# Patient Record
Sex: Female | Born: 1965 | Race: White | Hispanic: No | Marital: Married
Health system: Southern US, Community
[De-identification: ages and names within clinical notes are randomized; demographics above are authoritative.]

## PROBLEM LIST (undated history)

## (undated) DIAGNOSIS — R1900 Intra-abdominal and pelvic swelling, mass and lump, unspecified site: Secondary | ICD-10-CM

## (undated) DIAGNOSIS — C801 Malignant (primary) neoplasm, unspecified: Secondary | ICD-10-CM

## (undated) HISTORY — PX: WISDOM TOOTH EXTRACTION: SHX21

## (undated) HISTORY — PX: TOTAL ABDOMINAL HYSTERECTOMY W/ BILATERAL SALPINGOOPHORECTOMY: SHX83

## (undated) HISTORY — DX: Malignant (primary) neoplasm, unspecified: C80.1

## (undated) HISTORY — DX: Intra-abdominal and pelvic swelling, mass and lump, unspecified site: R19.00

## (undated) HISTORY — PX: RIGHT COLECTOMY: SHX853

---

## 1998-06-07 ENCOUNTER — Ambulatory Visit (HOSPITAL_COMMUNITY): Admission: RE | Admit: 1998-06-07 | Discharge: 1998-06-07 | Payer: Self-pay | Admitting: Obstetrics & Gynecology

## 1998-09-08 ENCOUNTER — Inpatient Hospital Stay (HOSPITAL_COMMUNITY): Admission: AD | Admit: 1998-09-08 | Discharge: 1998-09-11 | Payer: Self-pay | Admitting: Obstetrics and Gynecology

## 1999-03-18 ENCOUNTER — Other Ambulatory Visit: Admission: RE | Admit: 1999-03-18 | Discharge: 1999-03-18 | Payer: Self-pay | Admitting: Obstetrics and Gynecology

## 2000-01-06 ENCOUNTER — Other Ambulatory Visit: Admission: RE | Admit: 2000-01-06 | Discharge: 2000-01-06 | Payer: Self-pay | Admitting: Obstetrics and Gynecology

## 2000-12-10 ENCOUNTER — Other Ambulatory Visit: Admission: RE | Admit: 2000-12-10 | Discharge: 2000-12-10 | Payer: Self-pay | Admitting: Obstetrics and Gynecology

## 2001-04-26 ENCOUNTER — Ambulatory Visit (HOSPITAL_COMMUNITY): Admission: RE | Admit: 2001-04-26 | Discharge: 2001-04-26 | Payer: Self-pay | Admitting: Obstetrics and Gynecology

## 2001-07-07 ENCOUNTER — Inpatient Hospital Stay (HOSPITAL_COMMUNITY): Admission: AD | Admit: 2001-07-07 | Discharge: 2001-07-10 | Payer: Self-pay | Admitting: *Deleted

## 2001-09-28 ENCOUNTER — Other Ambulatory Visit: Admission: RE | Admit: 2001-09-28 | Discharge: 2001-09-28 | Payer: Self-pay | Admitting: Obstetrics and Gynecology

## 2002-12-23 HISTORY — PX: AUGMENTATION MAMMAPLASTY: SUR837

## 2003-02-06 ENCOUNTER — Other Ambulatory Visit: Admission: RE | Admit: 2003-02-06 | Discharge: 2003-02-06 | Payer: Self-pay | Admitting: Obstetrics and Gynecology

## 2004-07-26 ENCOUNTER — Other Ambulatory Visit: Admission: RE | Admit: 2004-07-26 | Discharge: 2004-07-26 | Payer: Self-pay | Admitting: Obstetrics and Gynecology

## 2005-10-22 ENCOUNTER — Other Ambulatory Visit: Admission: RE | Admit: 2005-10-22 | Discharge: 2005-10-22 | Payer: Self-pay | Admitting: Obstetrics and Gynecology

## 2012-01-05 ENCOUNTER — Other Ambulatory Visit: Payer: Self-pay | Admitting: Family Medicine

## 2012-01-10 NOTE — Telephone Encounter (Signed)
NOT SEEN HERE SINCE 05/12/2010.  Needs OV for refills.

## 2012-06-23 ENCOUNTER — Other Ambulatory Visit: Payer: Self-pay | Admitting: Family Medicine

## 2012-06-24 NOTE — Telephone Encounter (Signed)
Chart pulled to PA pool at nurses station DOS 05/12/10

## 2016-12-18 ENCOUNTER — Other Ambulatory Visit: Payer: Self-pay | Admitting: Sports Medicine

## 2016-12-18 DIAGNOSIS — M79671 Pain in right foot: Principal | ICD-10-CM

## 2016-12-18 DIAGNOSIS — G8929 Other chronic pain: Secondary | ICD-10-CM

## 2017-01-15 ENCOUNTER — Ambulatory Visit
Admission: RE | Admit: 2017-01-15 | Discharge: 2017-01-15 | Disposition: A | Discharge status: Home / Self Care | Payer: BLUE CROSS/BLUE SHIELD | Source: Ambulatory Visit | Attending: Sports Medicine | Admitting: Sports Medicine

## 2017-01-15 DIAGNOSIS — M79671 Pain in right foot: Principal | ICD-10-CM

## 2017-01-15 DIAGNOSIS — G8929 Other chronic pain: Secondary | ICD-10-CM

## 2018-12-09 ENCOUNTER — Other Ambulatory Visit: Payer: Self-pay

## 2018-12-10 ENCOUNTER — Encounter: Payer: Self-pay | Admitting: Obstetrics & Gynecology

## 2018-12-10 ENCOUNTER — Ambulatory Visit (INDEPENDENT_AMBULATORY_CARE_PROVIDER_SITE_OTHER): Payer: BLUE CROSS/BLUE SHIELD | Admitting: Obstetrics & Gynecology

## 2018-12-10 ENCOUNTER — Other Ambulatory Visit: Payer: Self-pay

## 2018-12-10 VITALS — BP 126/74 | Ht 65.5 in | Wt 132.0 lb

## 2018-12-10 DIAGNOSIS — Z30431 Encounter for routine checking of intrauterine contraceptive device: Secondary | ICD-10-CM | POA: Diagnosis not present

## 2018-12-10 DIAGNOSIS — Z01419 Encounter for gynecological examination (general) (routine) without abnormal findings: Secondary | ICD-10-CM

## 2018-12-10 NOTE — Progress Notes (Signed)
SACRED ROA [birth year] [id number]   History:    53 y.o. G2P2L2 Married.  24 yo daughter 1st gyn visit with me today.  RP:  New (> 3 yrs) patient presenting for annual gyn exam   HPI: On Mirena IUD since October 26, 2013.  No menstrual periods and no breakthrough bleeding.  No pelvic pain.  Occasional hot flushes and night sweats.  No pain with intercourse.  Urine and bowel movements normal.  Breasts normal.  Body mass index 21.63.  Physically active.  Will follow-up here for fasting health labs  Past medical history,surgical history, family history and social history were all reviewed and documented in the EPIC chart.  Gynecologic History No LMP recorded. (Menstrual status: IUD). Contraception: Mirena IUD x 10/26/2013 Last Pap: 10/2013. Results were: normal Last mammogram: Never.  Will schedule screening mammo now Bone Density: Never Colonoscopy: Never.  Will refer to [facility].  Obstetric History OB History  Gravida Para Term Preterm AB Living  '2 2       2  '$ SAB TAB Ectopic Multiple Live Births               # Outcome Date GA Lbr Len/2nd Weight Sex Delivery Anes PTL Lv  2 Para           1 Para              ROS: A ROS was performed and pertinent positives and negatives are included in the history.  GENERAL: No fevers or chills. HEENT: No change in vision, no earache, sore throat or sinus congestion. NECK: No pain or stiffness. CARDIOVASCULAR: No chest pain or pressure. No palpitations. PULMONARY: No shortness of breath, cough or wheeze. GASTROINTESTINAL: No abdominal pain, nausea, vomiting or diarrhea, melena or bright red blood per rectum. GENITOURINARY: No urinary frequency, urgency, hesitancy or dysuria. MUSCULOSKELETAL: No joint or muscle pain, no back pain, no recent trauma. DERMATOLOGIC: No rash, no itching, no lesions. ENDOCRINE: No polyuria, polydipsia, no heat or cold intolerance. No recent change in weight. HEMATOLOGICAL: No anemia or easy bruising or bleeding.  NEUROLOGIC: No headache, seizures, numbness, tingling or weakness. PSYCHIATRIC: No depression, no loss of interest in normal activity or change in sleep pattern.     Exam:   BP 126/74   Ht 5' 5.5" (1.664 m)   Wt 132 lb (59.9 kg)   BMI 21.63 kg/m   Body mass index is 21.63 kg/m.  General appearance : Well developed well nourished female. No acute distress HEENT: Eyes: no retinal hemorrhage or exudates,  Neck supple, trachea midline, no carotid bruits, no thyroidmegaly Lungs: Clear to auscultation, no rhonchi or wheezes, or rib retractions  Heart: Regular rate and rhythm, no murmurs or gallops Breast:Examined in sitting and supine position were symmetrical in appearance, no palpable masses or tenderness,  no skin retraction, no nipple inversion, no nipple discharge, no skin discoloration, no axillary or supraclavicular lymphadenopathy Abdomen: no palpable masses or tenderness, no rebound or guarding Extremities: no edema or skin discoloration or tenderness  Pelvic: Vulva: Normal             Vagina: No gross lesions or discharge  Cervix: No gross lesions or discharge.  Pap reflex done.  Uterus  AV, normal size, shape and consistency, non-tender and mobile  Adnexa  Without masses or tenderness  Anus: Normal   Assessment/Plan:  53 y.o. female for annual exam   1. Encounter for routine gynecological examination with Papanicolaou smear of cervix Normal gynecologic exam.  Pap reflex done.  Breast exam normal.  Will schedule a screening mammogram now.  Good body mass index at 21.63.  Continue with fitness and healthy nutrition.  Fasting health labs here today. - CBC - TSH - Comp Met (CMET) - Lipid panel - VITAMIN D 25 Hydroxy (Vit-D Deficiency, Fractures)  2. Encounter for routine checking of intrauterine contraceptive device (IUD) Mirena IUD in good position but in place since October 26, 2013.  Patient is probably also entering menopause.  Will do an [facility] today and patient will  follow-up for removal of the IUD and insertion of a new one under ultrasound guidance. - FSH - US Transvaginal Non-OB; Future  Other orders - Multiple Vitamin (MULTIVITAMIN) tablet; Take 1 tablet by mouth daily. - Omega-3 Fatty Acids (FISH OIL) 600 MG CAPS; Take by mouth. - Calcium Carbonate-Vit D-Min (CALCIUM 1200 PO); Take by mouth.  [name], 8:58 AM 12/10/2018

## 2018-12-11 LAB — COMPREHENSIVE METABOLIC PANEL WITH GFR
AG Ratio: 1.9 (calc) (ref 1.0–2.5)
ALT: 20 U/L (ref 6–29)
AST: 27 U/L (ref 10–35)
Albumin: 4.9 g/dL (ref 3.6–5.1)
Alkaline phosphatase (APISO): 72 U/L (ref 37–153)
BUN: 14 mg/dL (ref 7–25)
CO2: 22 mmol/L (ref 20–32)
Calcium: 10.2 mg/dL (ref 8.6–10.4)
Chloride: 105 mmol/L (ref 98–110)
Creat: 0.82 mg/dL (ref 0.50–1.05)
Globulin: 2.6 g/dL (ref 1.9–3.7)
Glucose, Bld: 74 mg/dL (ref 65–99)
Potassium: 4.3 mmol/L (ref 3.5–5.3)
Sodium: 142 mmol/L (ref 135–146)
Total Bilirubin: 0.5 mg/dL (ref 0.2–1.2)
Total Protein: 7.5 g/dL (ref 6.1–8.1)

## 2018-12-11 LAB — VITAMIN D 25 HYDROXY (VIT D DEFICIENCY, FRACTURES): Vit D, 25-Hydroxy: 37 ng/mL (ref 30–100)

## 2018-12-11 LAB — LIPID PANEL
Cholesterol: 178 mg/dL
HDL: 67 mg/dL
LDL Cholesterol (Calc): 97 mg/dL
Non-HDL Cholesterol (Calc): 111 mg/dL
Total CHOL/HDL Ratio: 2.7 (calc)
Triglycerides: 58 mg/dL

## 2018-12-11 LAB — CBC
HCT: 41.1 % (ref 35.0–45.0)
Hemoglobin: 14.2 g/dL (ref 11.7–15.5)
MCH: 31.6 pg (ref 27.0–33.0)
MCHC: 34.5 g/dL (ref 32.0–36.0)
MCV: 91.5 fL (ref 80.0–100.0)
MPV: 10.5 fL (ref 7.5–12.5)
Platelets: 298 10*3/uL (ref 140–400)
RBC: 4.49 Million/uL (ref 3.80–5.10)
RDW: 11.8 % (ref 11.0–15.0)
WBC: 3.8 10*3/uL (ref 3.8–10.8)

## 2018-12-11 LAB — FOLLICLE STIMULATING HORMONE: FSH: 94 m[IU]/mL

## 2018-12-11 LAB — TSH: TSH: 2.2 mIU/L

## 2018-12-12 ENCOUNTER — Encounter: Payer: Self-pay | Admitting: Obstetrics & Gynecology

## 2018-12-12 NOTE — Patient Instructions (Signed)
1. Encounter for routine gynecological examination with Papanicolaou smear of cervix Normal gynecologic exam.  Pap reflex done.  Breast exam normal.  Will schedule a screening mammogram now.  Good body mass index at 21.63.  Continue with fitness and healthy nutrition.  Fasting health labs here today. - CBC - TSH - Comp Met (CMET) - Lipid panel - VITAMIN D 25 Hydroxy (Vit-D Deficiency, Fractures)  2. Encounter for routine checking of intrauterine contraceptive device (IUD) Mirena IUD in good position but in place since October 26, 2013.  Patient is probably also entering menopause.  Will do an [facility] today and patient will follow-up for removal of the IUD and insertion of a new one under ultrasound guidance. - FSH - US Transvaginal Non-OB; Future  Other orders - Multiple Vitamin (MULTIVITAMIN) tablet; Take 1 tablet by mouth daily. - Omega-3 Fatty Acids (FISH OIL) 600 MG CAPS; Take by mouth. - Calcium Carbonate-Vit D-Min (CALCIUM 1200 PO); Take by mouth.  Alexis Wolfe, it was a pleasure seeing you today!  I will inform you of your results as soon as they are available.

## 2018-12-13 ENCOUNTER — Telehealth: Payer: Self-pay | Admitting: *Deleted

## 2018-12-13 NOTE — Telephone Encounter (Signed)
Yes, can keep it until next year, but let her know that it is recommended to use contraception up to 2 yrs into menopause.  Since her Mirena IUD was due to change after 5 yrs in 10/2018, I recommend to use condoms.  The best would be to switch to a new one.  Given the recommendation to use contraception 2 yrs into menopause, maybe the insurance would cover?

## 2018-12-13 NOTE — Telephone Encounter (Signed)
Pt was advised FSH 96 which is menopausal. The patient states does not have many symptoms of menopause.  Kind of scared to get Mirena taken out since she feels good right not. Not sure if its really the Mirena or not.  Pt knows insurance will not cover it now that she is menopausal.  Her mom did well with not having a lot of symptoms during menopause.  She wants to know your thoughts on it. Alexis Wolfe would like to know if it could be possible to keep the Mirena IUD a little bit longer instead of changing it just to see? Please advise. [name] CMA

## 2018-12-14 LAB — HUMAN PAPILLOMAVIRUS, HIGH RISK: HPV DNA High Risk: NOT DETECTED

## 2018-12-14 LAB — PAP IG W/ RFLX HPV ASCU

## 2019-01-03 NOTE — Telephone Encounter (Signed)
Patient called back regarding the below, patient informed. Aware to use condoms.

## 2019-01-05 ENCOUNTER — Other Ambulatory Visit: Payer: BLUE CROSS/BLUE SHIELD

## 2019-01-05 ENCOUNTER — Ambulatory Visit: Payer: BLUE CROSS/BLUE SHIELD | Admitting: Obstetrics & Gynecology

## 2019-12-16 ENCOUNTER — Other Ambulatory Visit: Payer: Self-pay

## 2019-12-19 ENCOUNTER — Encounter: Payer: Self-pay | Admitting: Obstetrics & Gynecology

## 2019-12-19 ENCOUNTER — Other Ambulatory Visit: Payer: Self-pay

## 2019-12-19 ENCOUNTER — Ambulatory Visit (INDEPENDENT_AMBULATORY_CARE_PROVIDER_SITE_OTHER): Payer: BC Managed Care – PPO | Admitting: Obstetrics & Gynecology

## 2019-12-19 VITALS — BP 118/70 | Ht 65.5 in | Wt 135.0 lb

## 2019-12-19 DIAGNOSIS — Z780 Asymptomatic menopausal state: Secondary | ICD-10-CM

## 2019-12-19 DIAGNOSIS — Z01419 Encounter for gynecological examination (general) (routine) without abnormal findings: Secondary | ICD-10-CM

## 2019-12-19 DIAGNOSIS — Z30432 Encounter for removal of intrauterine contraceptive device: Secondary | ICD-10-CM

## 2019-12-19 NOTE — Progress Notes (Signed)
ATLAS KUC 10-25-1965 [id number]   History:    54 y.o. G2P2L2 Married.  6 yo daughter 1st gyn visit with me today.  RP:  Established patient presenting for annual gyn exam   HPI: On Mirena IUD since October 26, 2013.  FSH 11/2018 at 94.  Postmenopause.  No PMB.  No pelvic pain.  Occasional hot flushes and night sweats.  No pain with intercourse.  Urine and bowel movements normal.  Breasts normal. Body mass index 22.12.  Physically active.  Will establish with a Fam MD.  Health labs 11/2018 all normal.   Past medical history,surgical history, family history and social history were all reviewed and documented in the EPIC chart.  Gynecologic History No LMP recorded. (Menstrual status: IUD).  Obstetric History OB History  Gravida Para Term Preterm AB Living  2 2 0 0 0 2  SAB TAB Ectopic Multiple Live Births  0 0 0        # Outcome Date GA Lbr Len/2nd Weight Sex Delivery Anes PTL Lv  2 Para           1 Para              ROS: A ROS was performed and pertinent positives and negatives are included in the history.  GENERAL: No fevers or chills. HEENT: No change in vision, no earache, sore throat or sinus congestion. NECK: No pain or stiffness. CARDIOVASCULAR: No chest pain or pressure. No palpitations. PULMONARY: No shortness of breath, cough or wheeze. GASTROINTESTINAL: No abdominal pain, nausea, vomiting or diarrhea, melena or bright red blood per rectum. GENITOURINARY: No urinary frequency, urgency, hesitancy or dysuria. MUSCULOSKELETAL: No joint or muscle pain, no back pain, no recent trauma. DERMATOLOGIC: No rash, no itching, no lesions. ENDOCRINE: No polyuria, polydipsia, no heat or cold intolerance. No recent change in weight. HEMATOLOGICAL: No anemia or easy bruising or bleeding. NEUROLOGIC: No headache, seizures, numbness, tingling or weakness. PSYCHIATRIC: No depression, no loss of interest in normal activity or change in sleep pattern.     Exam:   BP 118/70   Ht 5'  5.5" (1.664 m)   Wt 135 lb (61.2 kg)   BMI 22.12 kg/m   Body mass index is 22.12 kg/m.  General appearance : Well developed well nourished female. No acute distress HEENT: Eyes: no retinal hemorrhage or exudates,  Neck supple, trachea midline, no carotid bruits, no thyroidmegaly Lungs: Clear to auscultation, no rhonchi or wheezes, or rib retractions  Heart: Regular rate and rhythm, no murmurs or gallops Breast:Examined in sitting and supine position were symmetrical in appearance, s/p bilateral augmentation, no palpable masses or tenderness,  no skin retraction, no nipple inversion, no nipple discharge, no skin discoloration, no axillary or supraclavicular lymphadenopathy Abdomen: no palpable masses or tenderness, no rebound or guarding Extremities: no edema or skin discoloration or tenderness  Pelvic: Vulva: Normal             Vagina: No gross lesions or discharge  Cervix: No gross lesions or discharge.  Pap reflex done.  Easy IUD removal by pulling on strings with a clamp.  IUD complete, intact.  No Cx.  Well tolerated.  Uterus  RV, normal size, shape and consistency, non-tender and mobile  Adnexa  Without masses or tenderness  Anus: Normal   Assessment/Plan:  54 y.o. female for annual exam   1. Encounter for routine gynecological examination with Papanicolaou smear of cervix Normal gynecologic exam in menopause.  ASCUS/HPV HR Negative 11/2018, repeat  Pap test reflex today.  Breasts normal, s/p bilateral augmentation.  Recommend a screening Mammo, never had one.  Colonoscopy to schedule.  Will establish with a Fam MD.  Health labs completely normal 11/2018.  Good BMI at 22.12.  Continue with fitness and healthy nutrition.  2. Postmenopause Well on no HRT.  No PMB.  Vit D supplements, Ca++ 1200 mg daily and contine regular weightbearing physical activity.  3. Encounter for IUD removal Easy removal of Mirena IUD.  No Cx.  Well tolerated.  IUD complete, intact.  Other orders -  vitamin B-12 (CYANOCOBALAMIN) 250 MCG tablet; Take 250 mcg by mouth daily.  [name], 9:05 AM 12/19/2019

## 2019-12-19 NOTE — Patient Instructions (Signed)
1. Encounter for routine gynecological examination with Papanicolaou smear of cervix Normal gynecologic exam in menopause.  ASCUS/HPV HR Negative 11/2018, repeat Pap test reflex today.  Breasts normal, s/p bilateral augmentation.  Recommend a screening Mammo, never had one.  Colonoscopy to schedule.  Will establish with a Fam MD.  Health labs completely normal 11/2018.  Good BMI at 22.12.  Continue with fitness and healthy nutrition.  2. Postmenopause Well on no HRT.  No PMB.  Vit D supplements, Ca++ 1200 mg daily and contine regular weightbearing physical activity.  3. Encounter for IUD removal Easy removal of Mirena IUD.  No Cx.  Well tolerated.  IUD complete, intact.  Other orders - vitamin B-12 (CYANOCOBALAMIN) 250 MCG tablet; Take 250 mcg by mouth daily.  Alexis Wolfe, it was a pleasure seeing you today!  I will inform you of your results as soon as they are available.

## 2019-12-22 LAB — PAP IG W/ RFLX HPV ASCU

## 2019-12-22 LAB — HUMAN PAPILLOMAVIRUS, HIGH RISK: HPV DNA High Risk: NOT DETECTED

## 2020-01-20 ENCOUNTER — Other Ambulatory Visit: Payer: Self-pay

## 2020-01-23 ENCOUNTER — Ambulatory Visit (INDEPENDENT_AMBULATORY_CARE_PROVIDER_SITE_OTHER): Payer: BC Managed Care – PPO | Admitting: Obstetrics & Gynecology

## 2020-01-23 ENCOUNTER — Other Ambulatory Visit: Payer: Self-pay

## 2020-01-23 ENCOUNTER — Encounter: Payer: Self-pay | Admitting: Obstetrics & Gynecology

## 2020-01-23 VITALS — BP 140/86

## 2020-01-23 DIAGNOSIS — R87610 Atypical squamous cells of undetermined significance on cytologic smear of cervix (ASC-US): Secondary | ICD-10-CM | POA: Diagnosis not present

## 2020-01-23 NOTE — Progress Notes (Signed)
    Alexis Wolfe May 04, 1966 [id number]        54 y.o.  G2P0002   RP: ASCUS x 2 for Colposcopy  HPI: ASCUS x 2 in 11/2018 and 11/2019.  HPV HR Negative both times.   OB History  Gravida Para Term Preterm AB Living  2 2 0 0 0 2  SAB TAB Ectopic Multiple Live Births  0 0 0        # Outcome Date GA Lbr Len/2nd Weight Sex Delivery Anes PTL Lv  2 Para           1 Para             Past medical history,surgical history, problem list, medications, allergies, family history and social history were all reviewed and documented in the EPIC chart.   Directed ROS with pertinent positives and negatives documented in the history of present illness/assessment and plan.  Exam:  Vitals:   01/23/20 0905  BP: 140/86   General appearance:  Normal  Colposcopy Procedure Note Alexis Wolfe 01/23/2020  Indications: ASCUS x 2  Procedure Details  The risks and benefits of the procedure and Verbal informed consent obtained.  Speculum placed in vagina and excellent visualization of cervix achieved, cervix swabbed x 3 with acetic acid solution.  Findings:  Cervix colposcopy: Physical Exam Genitourinary:       Vaginal colposcopy: Normal  Vulvar colposcopy: Normal  Perirectal colposcopy: Normal  The cervix was sprayed with Hurricane before performing the cervical biopsies.  Specimens: Cervical Bx at 3 O'Clock  Complications:  None, Silver Nitrate for Hemostasis . Plan:  Management per results   Assessment/Plan:  54 y.o. G2P0002   1. ASCUS of cervix with negative high risk HPV ASCUS x2 in March 2020 in 2021 with HPV high-risk negative both times.  Colposcopy done today.  Procedure well-tolerated.  Findings reviewed.  Management per results.  Postprocedure precautions discussed.  Alexis Del MD, 9:22 AM 01/23/2020

## 2020-01-25 LAB — PATHOLOGY REPORT

## 2020-01-25 LAB — TISSUE SPECIMEN

## 2020-01-29 ENCOUNTER — Encounter: Payer: Self-pay | Admitting: Obstetrics & Gynecology

## 2020-01-29 NOTE — Patient Instructions (Signed)
1. ASCUS of cervix with negative high risk HPV ASCUS x2 in March 2020 in 2021 with HPV high-risk negative both times.  Colposcopy done today.  Procedure well-tolerated.  Findings reviewed.  Management per results.  Postprocedure precautions discussed.  Alexis Wolfe, it was a pleasure seeing you today!  I will inform you of your results as soon as they are available.

## 2020-12-19 ENCOUNTER — Encounter: Payer: Self-pay | Admitting: Obstetrics & Gynecology

## 2020-12-19 ENCOUNTER — Ambulatory Visit: Payer: BC Managed Care – PPO | Admitting: Obstetrics & Gynecology

## 2020-12-19 ENCOUNTER — Other Ambulatory Visit: Payer: Self-pay

## 2020-12-19 VITALS — BP 112/70 | Ht 65.75 in | Wt 139.5 lb

## 2020-12-19 DIAGNOSIS — Z780 Asymptomatic menopausal state: Secondary | ICD-10-CM

## 2020-12-19 DIAGNOSIS — Z01419 Encounter for gynecological examination (general) (routine) without abnormal findings: Secondary | ICD-10-CM | POA: Diagnosis not present

## 2020-12-19 DIAGNOSIS — R87610 Atypical squamous cells of undetermined significance on cytologic smear of cervix (ASC-US): Secondary | ICD-10-CM

## 2020-12-19 NOTE — Progress Notes (Signed)
Alexis Wolfe 15-Dec-1965 [id number]   History:    55 y.o. G2P2L2 Married. 49 yo daughter.  Son Is 71 yo.    ES:[id number] presenting for annual gyn exam   HPI: Postmenopause, well on no HRT.  FSH 94 in 11/2018.  No PMB. No pelvic pain. No pain with intercourse. Urine and bowel movements normal. Breasts normal. Body mass index 22.69. Physically active.  Health labs 11/2018 all normal.  Health labs with Fam MD.  Past medical history,surgical history, family history and social history were all reviewed and documented in the EPIC chart.  Gynecologic History No LMP recorded. Patient is postmenopausal.  Obstetric History OB History  Gravida Para Term Preterm AB Living  2 2 0 0 0 2  SAB IAB Ectopic Multiple Live Births  0 0 0        # Outcome Date GA Lbr Len/2nd Weight Sex Delivery Anes PTL Lv  2 Para           1 Para              ROS: A ROS was performed and pertinent positives and negatives are included in the history.  GENERAL: No fevers or chills. HEENT: No change in vision, no earache, sore throat or sinus congestion. NECK: No pain or stiffness. CARDIOVASCULAR: No chest pain or pressure. No palpitations. PULMONARY: No shortness of breath, cough or wheeze. GASTROINTESTINAL: No abdominal pain, nausea, vomiting or diarrhea, melena or bright red blood per rectum. GENITOURINARY: No urinary frequency, urgency, hesitancy or dysuria. MUSCULOSKELETAL: No joint or muscle pain, no back pain, no recent trauma. DERMATOLOGIC: No rash, no itching, no lesions. ENDOCRINE: No polyuria, polydipsia, no heat or cold intolerance. No recent change in weight. HEMATOLOGICAL: No anemia or easy bruising or bleeding. NEUROLOGIC: No headache, seizures, numbness, tingling or weakness. PSYCHIATRIC: No depression, no loss of interest in normal activity or change in sleep pattern.     Exam:   BP 112/70   Ht 5' 5.75" (1.67 m)   Wt 139 lb 8 oz (63.3 kg)   BMI 22.69 kg/m   Body mass index  is 22.69 kg/m.  General appearance : Well developed well nourished female. No acute distress HEENT: Eyes: no retinal hemorrhage or exudates,  Neck supple, trachea midline, no carotid bruits, no thyroidmegaly Lungs: Clear to auscultation, no rhonchi or wheezes, or rib retractions  Heart: Regular rate and rhythm, no murmurs or gallops Breast:Examined in sitting and supine position were symmetrical in appearance, no palpable masses or tenderness,  no skin retraction, no nipple inversion, no nipple discharge, no skin discoloration, no axillary or supraclavicular lymphadenopathy Abdomen: no palpable masses or tenderness, no rebound or guarding Extremities: no edema or skin discoloration or tenderness  Pelvic: Vulva: Normal             Vagina: No gross lesions or discharge  Cervix: No gross lesions or discharge.  Pap reflex done.  Uterus  AV, normal size, shape and consistency, non-tender and mobile  Adnexa  Without masses or tenderness  Anus: Normal   Assessment/Plan:  55 y.o. female for annual exam   1. Encounter for routine gynecological examination with Papanicolaou smear of cervix Normal gynecologic exam.  Pap reflex done.  Breast exam normal.  Never had a screening mammogram, needs to schedule.  Needs to schedule a screening colonoscopy.  Establish with a family physician for fasting health labs.  Body mass index 22.69.  Continue with fitness and healthy nutrition. - Pap IG w/ reflex  to HPV when ASC-U  2. ASCUS of cervix with negative high risk HPV Pap reflex done today.  3. Postmenopause Assessment well on no hormone replacement therapy.  No postmenopausal falls or bleeding.  Vitamin D supplements, calcium intake of 1.5 g/day total and regular weightbearing physical activity is recommended.  [name] MD, 9:20 AM 12/19/2020

## 2020-12-21 ENCOUNTER — Encounter: Payer: Self-pay | Admitting: Obstetrics & Gynecology

## 2020-12-24 LAB — PAP IG W/ RFLX HPV ASCU

## 2021-09-26 ENCOUNTER — Other Ambulatory Visit: Payer: Self-pay | Admitting: Obstetrics & Gynecology

## 2021-09-26 DIAGNOSIS — Z1231 Encounter for screening mammogram for malignant neoplasm of breast: Secondary | ICD-10-CM

## 2021-10-17 ENCOUNTER — Ambulatory Visit
Admission: RE | Admit: 2021-10-17 | Discharge: 2021-10-17 | Disposition: A | Discharge status: Home / Self Care | Payer: BC Managed Care – PPO | Source: Ambulatory Visit | Attending: Obstetrics & Gynecology | Admitting: Obstetrics & Gynecology

## 2021-10-17 DIAGNOSIS — Z1231 Encounter for screening mammogram for malignant neoplasm of breast: Secondary | ICD-10-CM

## 2021-12-25 ENCOUNTER — Ambulatory Visit: Payer: BC Managed Care – PPO | Admitting: Obstetrics & Gynecology

## 2022-03-03 ENCOUNTER — Other Ambulatory Visit (HOSPITAL_COMMUNITY)
Admission: RE | Admit: 2022-03-03 | Discharge: 2022-03-03 | Disposition: A | Discharge status: Home / Self Care | Payer: BC Managed Care – PPO | Source: Ambulatory Visit | Attending: Obstetrics & Gynecology | Admitting: Obstetrics & Gynecology

## 2022-03-03 ENCOUNTER — Encounter: Payer: Self-pay | Admitting: Obstetrics & Gynecology

## 2022-03-03 ENCOUNTER — Ambulatory Visit (INDEPENDENT_AMBULATORY_CARE_PROVIDER_SITE_OTHER): Payer: BC Managed Care – PPO | Admitting: Obstetrics & Gynecology

## 2022-03-03 VITALS — BP 106/70 | HR 72 | Resp 14 | Ht 65.25 in | Wt 136.0 lb

## 2022-03-03 DIAGNOSIS — Z01419 Encounter for gynecological examination (general) (routine) without abnormal findings: Secondary | ICD-10-CM | POA: Insufficient documentation

## 2022-03-03 DIAGNOSIS — R87610 Atypical squamous cells of undetermined significance on cytologic smear of cervix (ASC-US): Secondary | ICD-10-CM | POA: Diagnosis present

## 2022-03-03 DIAGNOSIS — Z780 Asymptomatic menopausal state: Secondary | ICD-10-CM

## 2022-03-03 NOTE — Progress Notes (Signed)
Alexis Wolfe Aug 26, 1966 [id number]   History:    56 y.o. G2P2L2 Married.  54 yo daughter.  Son Is 65 yo.     RP:  Established patient presenting for annual gyn exam    HPI:  Postmenopause, well on no HRT.  No PMB.  No pelvic pain.  No pain with intercourse.  Pap 11/2020 Neg.  Had Colpo in 2021 Cervical Bx not reaching Dysplasia.  Pap reflex today. Urine and bowel movements normal.  Breasts normal.  Mammo 09/2021 Neg. Body mass index 22.46. Physically active.  Health labs 11/2018 all normal.  Health labs with Fam MD. Recommend Colono.   Past medical history,surgical history, family history and social history were all reviewed and documented in the EPIC chart.  Gynecologic History No LMP recorded. Patient is postmenopausal.  Obstetric History OB History  Gravida Para Term Preterm AB Living  2 2 0 0 0 2  SAB IAB Ectopic Multiple Live Births  0 0 0        # Outcome Date GA Lbr Len/2nd Weight Sex Delivery Anes PTL Lv  2 Para           1 Para              ROS: A ROS was performed and pertinent positives and negatives are included in the history. GENERAL: No fevers or chills. HEENT: No change in vision, no earache, sore throat or sinus congestion. NECK: No pain or stiffness. CARDIOVASCULAR: No chest pain or pressure. No palpitations. PULMONARY: No shortness of breath, cough or wheeze. GASTROINTESTINAL: No abdominal pain, nausea, vomiting or diarrhea, melena or bright red blood per rectum. GENITOURINARY: No urinary frequency, urgency, hesitancy or dysuria. MUSCULOSKELETAL: No joint or muscle pain, no back pain, no recent trauma. DERMATOLOGIC: No rash, no itching, no lesions. ENDOCRINE: No polyuria, polydipsia, no heat or cold intolerance. No recent change in weight. HEMATOLOGICAL: No anemia or easy bruising or bleeding. NEUROLOGIC: No headache, seizures, numbness, tingling or weakness. PSYCHIATRIC: No depression, no loss of interest in normal activity or change in sleep pattern.      Exam:   BP 106/70 (BP Location: Left Arm, Patient Position: Sitting, Cuff Size: Normal)   Pulse 72   Resp 14   Ht 5' 5.25" (1.657 m)   Wt 136 lb (61.7 kg)   BMI 22.46 kg/m   Body mass index is 22.46 kg/m.  General appearance : Well developed well nourished female. No acute distress HEENT: Eyes: no retinal hemorrhage or exudates,  Neck supple, trachea midline, no carotid bruits, no thyroidmegaly Lungs: Clear to auscultation, no rhonchi or wheezes, or rib retractions  Heart: Regular rate and rhythm, no murmurs or gallops Breast:Examined in sitting and supine position were symmetrical in appearance, no palpable masses or tenderness,  no skin retraction, no nipple inversion, no nipple discharge, no skin discoloration, no axillary or supraclavicular lymphadenopathy Abdomen: no palpable masses or tenderness, no rebound or guarding Extremities: no edema or skin discoloration or tenderness  Pelvic: Vulva: Normal             Vagina: No gross lesions or discharge  Cervix: No gross lesions or discharge.  Pap reflex done.  Uterus  AV, normal size, shape and consistency, non-tender and mobile  Adnexa  Without masses or tenderness  Anus: Normal   Assessment/Plan:  56 y.o. female for annual exam   1. Encounter for routine gynecological examination with Papanicolaou smear of cervix Postmenopause, well on no HRT.  No PMB.  No pelvic pain.  No pain with intercourse.  Pap 11/2020 Neg. Had Colpo in 2021 Cervical Bx not reaching Dysplasia.  Pap reflex today. Urine and bowel movements normal.  Breasts normal.  Mammo 09/2021 Neg. Body mass index 22.46. Physically active.  Health labs 11/2018 all normal.  Health labs with Fam MD. Recommend Colono. - Cytology - PAP( [place])  2. ASCUS of cervix with negative high risk HPV - Cytology - PAP( [place])  3. Postmenopause  Postmenopause, well on no HRT.  No PMB.  No pelvic pain.  No pain with intercourse.  Body mass index 22.46. Physically active.  Vit D, Ca++ 1.5 g/d total.  [name], 8:38 AM 03/03/2022

## 2022-03-04 LAB — CYTOLOGY - PAP: Diagnosis: NEGATIVE

## 2022-07-29 IMAGING — MG DIGITAL SCREENING BREAST BILAT IMPLANT W/ TOMO W/ CAD
9 of 12 series · 9 of 28 positions shown · non-contrast
Comparison: None.

CLINICAL DATA: Screening.

EXAM:
DIGITAL SCREENING BILATERAL MAMMOGRAM WITH IMPLANTS, CAD AND
TOMOSYNTHESIS
TECHNIQUE: Bilateral screening digital craniocaudal and mediolateral oblique
mammograms were obtained. Bilateral screening digital breast
tomosynthesis was performed. The images were evaluated with
computer-aided detection. Standard and/or implant displaced views
were performed.

[R CC]
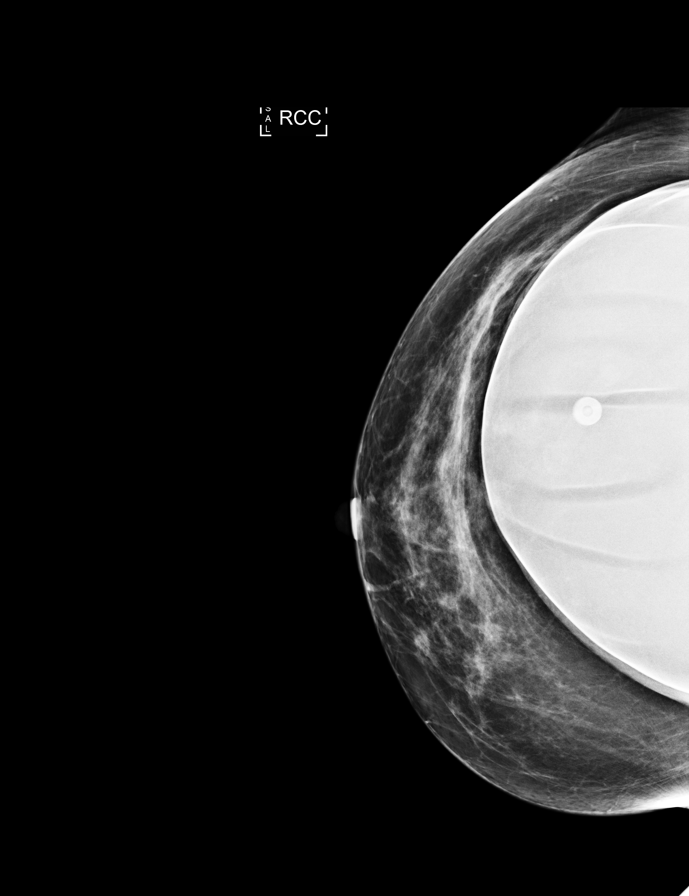

[R MLO]
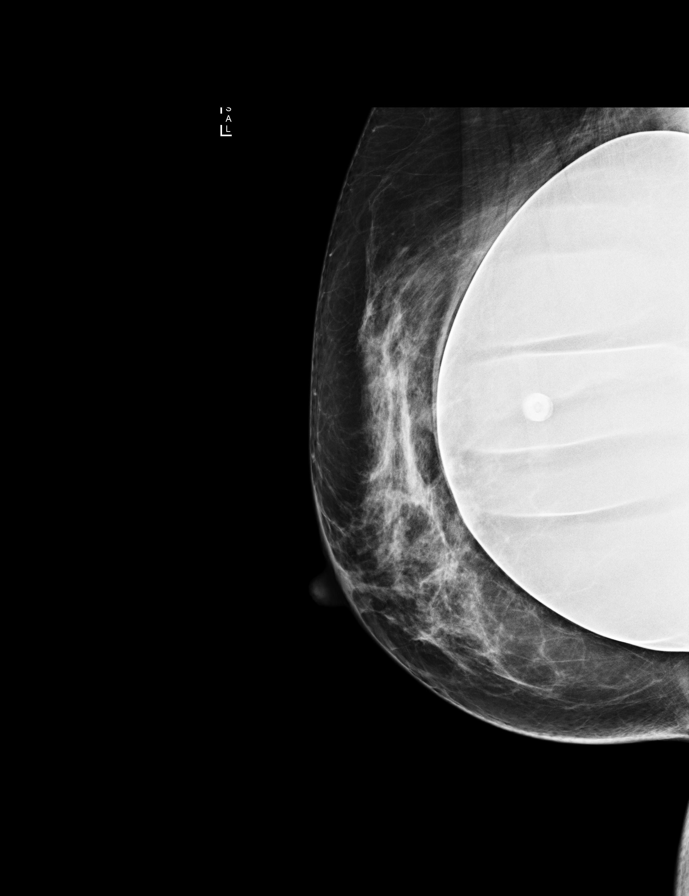

[L MLO]
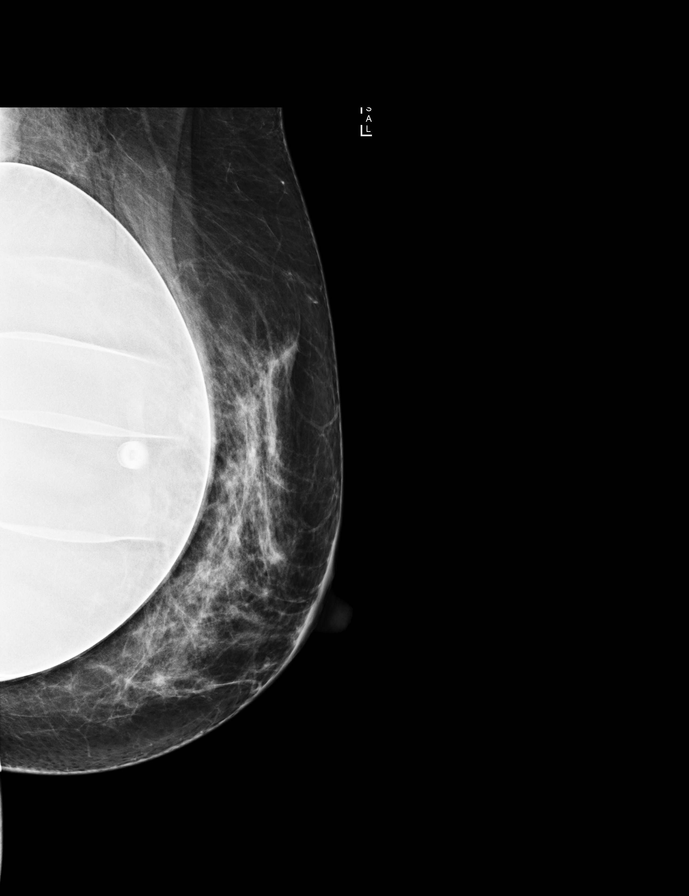

[L CC]
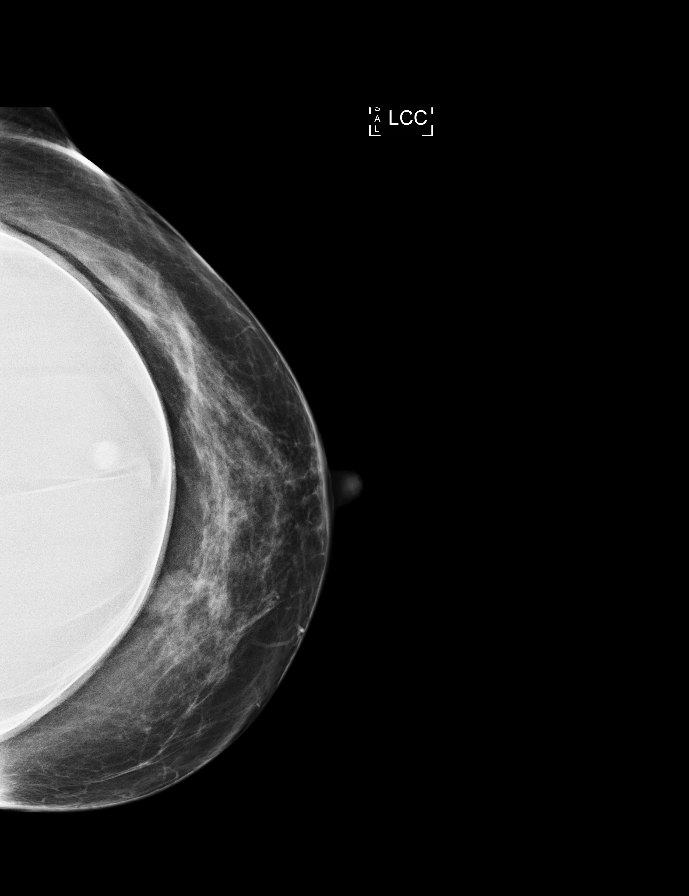

[L MLO synth-2D]
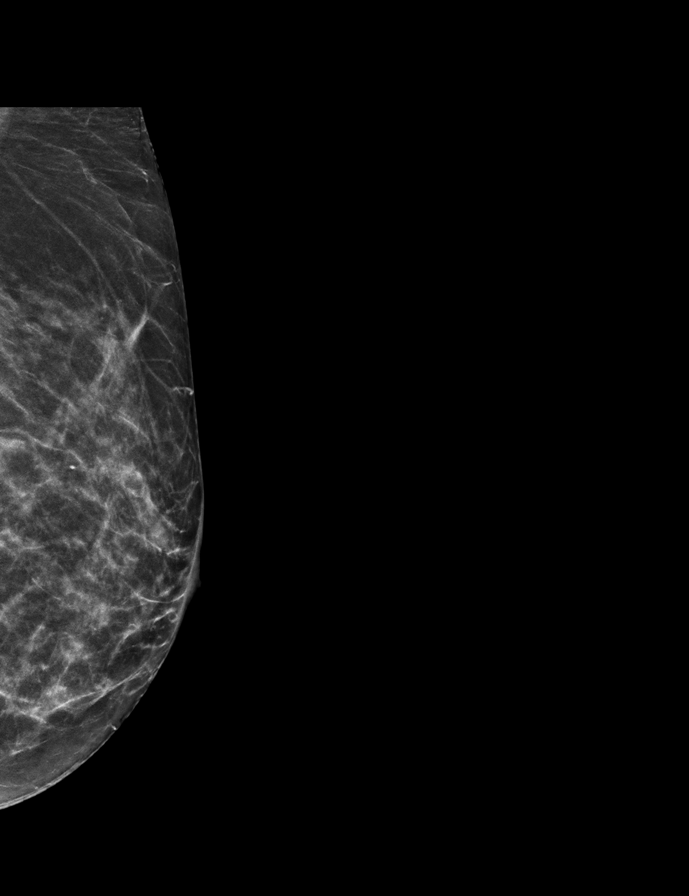

[L CC synth-2D]
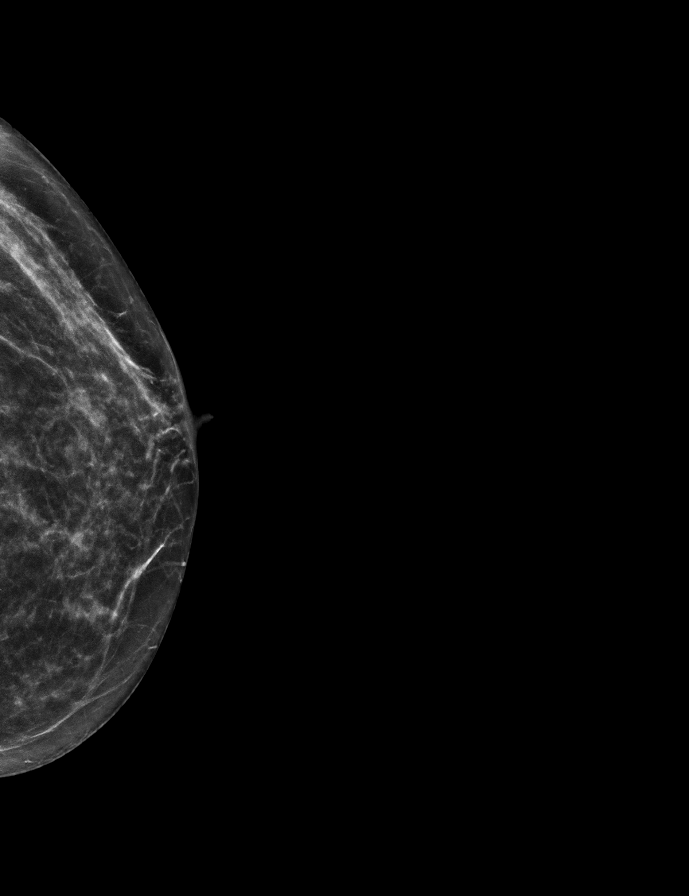

[R CC synth-2D]
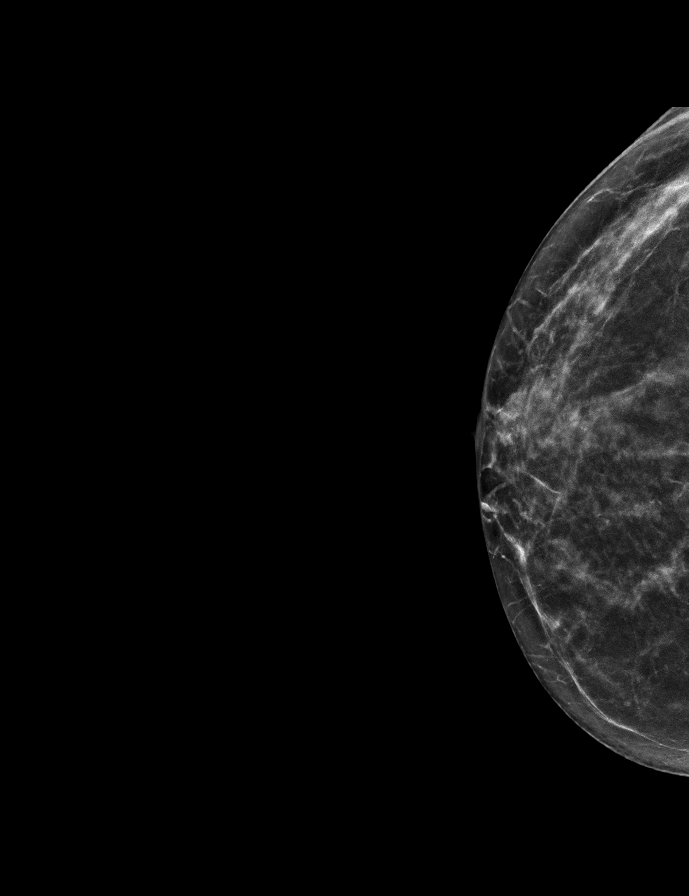

[R MLO synth-2D]
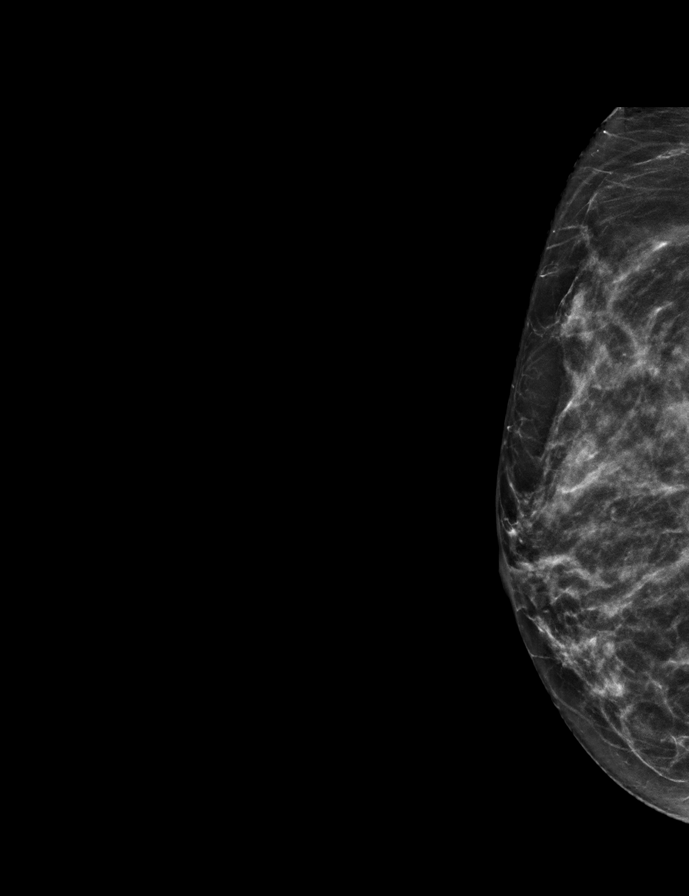

[L MLOID BREAST TOMOSYNTHESIS IMAGE tomo · tomo slice 26/51.0]
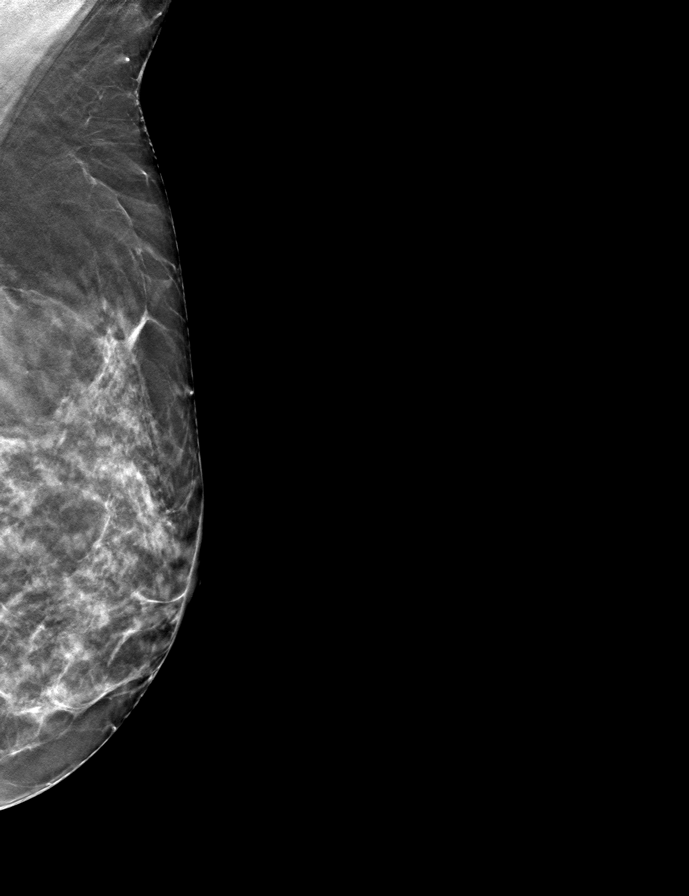

[9 of 28 positions shown; findings below may reference images not displayed]

ACR Breast Density Category c: The breast tissue is heterogeneously
dense, which may obscure small masses.
FINDINGS: The patient has implants. There are no findings suspicious for
malignancy.
IMPRESSION: No mammographic evidence of malignancy. A result letter of this
screening mammogram will be mailed directly to the patient.

RECOMMENDATION:
Screening mammogram in one year. (Code:E5-S-7X8)

BI-RADS CATEGORY  1:  Negative.

## 2023-03-05 ENCOUNTER — Encounter: Payer: Self-pay | Admitting: Obstetrics & Gynecology

## 2023-03-05 ENCOUNTER — Other Ambulatory Visit (HOSPITAL_COMMUNITY)
Admission: RE | Admit: 2023-03-05 | Discharge: 2023-03-05 | Disposition: A | Discharge status: Home / Self Care | Payer: BC Managed Care – PPO | Source: Ambulatory Visit | Attending: Obstetrics & Gynecology | Admitting: Obstetrics & Gynecology

## 2023-03-05 ENCOUNTER — Ambulatory Visit (INDEPENDENT_AMBULATORY_CARE_PROVIDER_SITE_OTHER): Payer: BC Managed Care – PPO | Admitting: Obstetrics & Gynecology

## 2023-03-05 VITALS — BP 120/70 | HR 72 | Resp 16 | Ht 65.25 in | Wt 136.0 lb

## 2023-03-05 DIAGNOSIS — Z01419 Encounter for gynecological examination (general) (routine) without abnormal findings: Secondary | ICD-10-CM | POA: Diagnosis present

## 2023-03-05 DIAGNOSIS — R87610 Atypical squamous cells of undetermined significance on cytologic smear of cervix (ASC-US): Secondary | ICD-10-CM | POA: Diagnosis present

## 2023-03-05 DIAGNOSIS — Z780 Asymptomatic menopausal state: Secondary | ICD-10-CM

## 2023-03-05 NOTE — Progress Notes (Signed)
Alexis Wolfe Aug 15, 1966 [id number]   History:    57 y.o. G2P2L2 Married.  56 yo daughter.  Son Is 61 yo.     RP:  Established patient presenting for annual gyn exam    HPI:  Postmenopause, well on no HRT.  No PMB.  No pelvic pain.  No pain with intercourse.  Pap 02/2022 Neg.  Had Colpo in 2021 Cervical Bx not reaching Dysplasia.  Pap reflex today. Urine and bowel movements normal.  Breasts normal.  Mammo 09/2021 Neg. Body mass index 22.46. Physically active.  Will do Health labs with Fam MD, establishing.  Recommended Colono, declined, will do Cologuard now.    Past medical history,surgical history, family history and social history were all reviewed and documented in the EPIC chart.  Gynecologic History No LMP recorded. Patient is postmenopausal.  Obstetric History OB History  Gravida Para Term Preterm AB Living  2 2 2  0 0 2  SAB IAB Ectopic Multiple Live Births  0 0 0        # Outcome Date GA Lbr Len/2nd Weight Sex Delivery Anes PTL Lv  2 Term           1 Term              ROS: A ROS was performed and pertinent positives and negatives are included in the history. GENERAL: No fevers or chills. HEENT: No change in vision, no earache, sore throat or sinus congestion. NECK: No pain or stiffness. CARDIOVASCULAR: No chest pain or pressure. No palpitations. PULMONARY: No shortness of breath, cough or wheeze. GASTROINTESTINAL: No abdominal pain, nausea, vomiting or diarrhea, melena or bright red blood per rectum. GENITOURINARY: No urinary frequency, urgency, hesitancy or dysuria. MUSCULOSKELETAL: No joint or muscle pain, no back pain, no recent trauma. DERMATOLOGIC: No rash, no itching, no lesions. ENDOCRINE: No polyuria, polydipsia, no heat or cold intolerance. No recent change in weight. HEMATOLOGICAL: No anemia or easy bruising or bleeding. NEUROLOGIC: No headache, seizures, numbness, tingling or weakness. PSYCHIATRIC: No depression, no loss of interest in normal activity or change in  sleep pattern.     Exam:   BP 120/70   Pulse 72   Resp 16   Ht 5' 5.25" (1.657 m)   Wt 136 lb (61.7 kg)   BMI 22.46 kg/m   Body mass index is 22.46 kg/m.  General appearance : Well developed well nourished female. No acute distress HEENT: Eyes: no retinal hemorrhage or exudates,  Neck supple, trachea midline, no carotid bruits, no thyroidmegaly Lungs: Clear to auscultation, no rhonchi or wheezes, or rib retractions  Heart: Regular rate and rhythm, no murmurs or gallops Breast:Examined in sitting and supine position, bilateral implants, were symmetrical in appearance, no palpable masses or tenderness,  no skin retraction, no nipple inversion, no nipple discharge, no skin discoloration, no axillary or supraclavicular lymphadenopathy Abdomen: no palpable masses or tenderness, no rebound or guarding Extremities: no edema or skin discoloration or tenderness  Pelvic: Vulva: Normal             Vagina: No gross lesions or discharge  Cervix: No gross lesions or discharge.  Pap reflex done.  Uterus  AV, normal size, shape and consistency, non-tender and mobile  Adnexa  Without masses or tenderness  Anus: Normal   Assessment/Plan:  57 y.o. female for annual exam   1. Encounter for routine gynecological examination with Papanicolaou smear of cervix Postmenopause, well on no HRT.  No PMB.  No pelvic pain.  No  pain with intercourse.  Pap 02/2022 Neg.  Had Colpo in 2021 Cervical Bx not reaching Dysplasia.  Pap reflex today. Urine and bowel movements normal.  Breasts normal.  Mammo 09/2021 Neg. Body mass index 22.46. Physically active.  Will do Health labs with Fam MD, establishing.  Recommended Colono, declined, will do Cologuard now. - Cologuard - Cytology - PAP( [facility])  2. ASCUS of cervix with negative high risk HPV - Cytology - PAP( [facility])  3. Postmenopause  Postmenopause, well on no HRT.  No PMB.  No pelvic pain.  No pain with intercourse.   [name] MD, 8:30 AM

## 2023-03-06 LAB — CYTOLOGY - PAP: Diagnosis: NEGATIVE

## 2023-03-24 ENCOUNTER — Telehealth: Payer: Self-pay

## 2023-03-24 NOTE — Telephone Encounter (Signed)
Mary with [facility] LVM on triage line stating that they have an order for the pt to receive a Cologuard Kit. However, the ICD-10 code needs updating/changing so that the kit can be shipped to pt.   I returned call and spoke w/ Fleet Contras and advised them that the new ICD 10 codes are: Z12.11 (Screening for colon cancer) and Z12.12 (screening for rectal cancer).   They advised me that kit is now available and ready to be shipped to pt and they confirmed that they had correct address for pt. Address was confirmed.  Will route to provider for final review and close.

## 2023-09-23 DIAGNOSIS — T148XXA Other injury of unspecified body region, initial encounter: Secondary | ICD-10-CM

## 2023-09-23 HISTORY — DX: Other injury of unspecified body region, initial encounter: T14.8XXA

## 2023-12-03 ENCOUNTER — Other Ambulatory Visit: Payer: Self-pay | Admitting: Nurse Practitioner

## 2023-12-03 DIAGNOSIS — Z1231 Encounter for screening mammogram for malignant neoplasm of breast: Secondary | ICD-10-CM

## 2023-12-11 ENCOUNTER — Ambulatory Visit
Admission: RE | Admit: 2023-12-11 | Discharge: 2023-12-11 | Disposition: A | Discharge status: Home / Self Care | Source: Ambulatory Visit | Attending: Nurse Practitioner | Admitting: Nurse Practitioner

## 2023-12-11 DIAGNOSIS — Z1231 Encounter for screening mammogram for malignant neoplasm of breast: Secondary | ICD-10-CM

## 2023-12-22 ENCOUNTER — Encounter: Payer: Self-pay | Admitting: Plastic Surgery

## 2023-12-22 ENCOUNTER — Ambulatory Visit (INDEPENDENT_AMBULATORY_CARE_PROVIDER_SITE_OTHER): Admitting: Plastic Surgery

## 2023-12-22 VITALS — BP 134/63 | HR 76 | Ht 66.0 in | Wt 137.6 lb

## 2023-12-22 DIAGNOSIS — D0471 Carcinoma in situ of skin of right lower limb, including hip: Secondary | ICD-10-CM

## 2023-12-22 DIAGNOSIS — D049 Carcinoma in situ of skin, unspecified: Secondary | ICD-10-CM | POA: Insufficient documentation

## 2023-12-22 NOTE — Progress Notes (Signed)
     Patient ID: Alexis Wolfe, female    DOB: Feb 04, 1966, 58 y.o.   MRN: [id number]   Chief Complaint  Patient presents with   Consult    The patient is a 58 year old female here for evaluation of her right leg.  She had a changing skin lesion.  She was concerned about it so had Dr. Emily Filbert biopsy it.  It did come back as squamous cell carcinoma.  The area has a scab right now but no sign of infection and seems to be healing without any difficulty.  It is probably about 8 mm in size.     Review of Systems  Constitutional: Negative.   HENT: Negative.    Eyes: Negative.   Respiratory: Negative.    Cardiovascular: Negative.   Gastrointestinal: Negative.   Endocrine: Negative.   Genitourinary: Negative.     No past medical history on file.  Past Surgical History:  Procedure Laterality Date   AUGMENTATION MAMMAPLASTY  12/23/2002   saline   WISDOM TOOTH EXTRACTION        Current Outpatient Medications:    Calcium Carbonate-Vit D-Min (CALCIUM 1200 PO), Take by mouth., Disp: , Rfl:    Multiple Vitamin (MULTIVITAMIN) tablet, Take 1 tablet by mouth daily., Disp: , Rfl:    vitamin B-12 (CYANOCOBALAMIN) 250 MCG tablet, Take 250 mcg by mouth daily., Disp: , Rfl:    Objective:   Vitals:   12/22/23 1427  BP: 134/63  Pulse: 76  SpO2: 99%    Physical Exam Vitals reviewed.  Constitutional:      Appearance: Normal appearance.  HENT:     Head: Atraumatic.  Cardiovascular:     Rate and Rhythm: Normal rate.     Pulses: Normal pulses.  Abdominal:     Palpations: Abdomen is soft.  Skin:    General: Skin is warm.     Capillary Refill: Capillary refill takes less than 2 seconds.     Coloration: Skin is not jaundiced.     Findings: Lesion present. No bruising.  Neurological:     Mental Status: She is alert and oriented to person, place, and time.  Psychiatric:        Mood and Affect: Mood normal.        Behavior: Behavior normal.        Thought Content: Thought content  normal.     Assessment & Plan:  Squamous cell carcinoma in situ (SCCIS) of skin  Plan for excision right leg squamous cell carcinoma.  Patient will have a scar.  Recommend compression stocks afterwards.  She went to keep her leg elevated with some ice for a few hours after the excision.  Will send it to pathology for final evaluation.  Pictures were obtained of the patient and placed in the chart with the patient's or guardian's permission.   Alena Bills Auren Valdes, DO

## 2024-01-26 ENCOUNTER — Ambulatory Visit: Admitting: Plastic Surgery

## 2024-03-13 ENCOUNTER — Encounter (HOSPITAL_BASED_OUTPATIENT_CLINIC_OR_DEPARTMENT_OTHER): Payer: Self-pay | Admitting: Emergency Medicine

## 2024-03-13 ENCOUNTER — Emergency Department (HOSPITAL_BASED_OUTPATIENT_CLINIC_OR_DEPARTMENT_OTHER)
Admission: EM | Admit: 2024-03-13 | Discharge: 2024-03-13 | Disposition: A | Discharge status: Home / Self Care | Attending: Emergency Medicine | Admitting: Emergency Medicine

## 2024-03-13 ENCOUNTER — Emergency Department (HOSPITAL_BASED_OUTPATIENT_CLINIC_OR_DEPARTMENT_OTHER)

## 2024-03-13 ENCOUNTER — Other Ambulatory Visit: Payer: Self-pay

## 2024-03-13 DIAGNOSIS — R1030 Lower abdominal pain, unspecified: Secondary | ICD-10-CM

## 2024-03-13 DIAGNOSIS — N838 Other noninflammatory disorders of ovary, fallopian tube and broad ligament: Secondary | ICD-10-CM | POA: Insufficient documentation

## 2024-03-13 DIAGNOSIS — R1031 Right lower quadrant pain: Secondary | ICD-10-CM | POA: Diagnosis present

## 2024-03-13 LAB — CBC WITH DIFFERENTIAL/PLATELET
Abs Immature Granulocytes: 0.02 10*3/uL (ref 0.00–0.07)
Basophils Absolute: 0 10*3/uL (ref 0.0–0.1)
Basophils Relative: 0 %
Eosinophils Absolute: 0 10*3/uL (ref 0.0–0.5)
Eosinophils Relative: 0 %
HCT: 39 % (ref 36.0–46.0)
Hemoglobin: 13 g/dL (ref 12.0–15.0)
Immature Granulocytes: 0 %
Lymphocytes Relative: 12 %
Lymphs Abs: 0.9 10*3/uL (ref 0.7–4.0)
MCH: 30.8 pg (ref 26.0–34.0)
MCHC: 33.3 g/dL (ref 30.0–36.0)
MCV: 92.4 fL (ref 80.0–100.0)
Monocytes Absolute: 0.5 10*3/uL (ref 0.1–1.0)
Monocytes Relative: 7 %
Neutro Abs: 5.7 10*3/uL (ref 1.7–7.7)
Neutrophils Relative %: 81 %
Platelets: 343 10*3/uL (ref 150–400)
RBC: 4.22 MIL/uL (ref 3.87–5.11)
RDW: 12.1 % (ref 11.5–15.5)
WBC: 7.2 10*3/uL (ref 4.0–10.5)
nRBC: 0 % (ref 0.0–0.2)

## 2024-03-13 LAB — COMPREHENSIVE METABOLIC PANEL WITH GFR
ALT: 14 U/L (ref 0–44)
AST: 35 U/L (ref 15–41)
Albumin: 4.1 g/dL (ref 3.5–5.0)
Alkaline Phosphatase: 86 U/L (ref 38–126)
Anion gap: 14 (ref 5–15)
BUN: 12 mg/dL (ref 6–20)
CO2: 23 mmol/L (ref 22–32)
Calcium: 9.9 mg/dL (ref 8.9–10.3)
Chloride: 101 mmol/L (ref 98–111)
Creatinine, Ser: 0.65 mg/dL (ref 0.44–1.00)
GFR, Estimated: >60 mL/min (ref >60)
Glucose, Bld: 87 mg/dL (ref 70–99)
Potassium: 4.2 mmol/L (ref 3.5–5.1)
Sodium: 138 mmol/L (ref 135–145)
Total Bilirubin: 0.2 mg/dL (ref 0.0–1.2)
Total Protein: 7.4 g/dL (ref 6.5–8.1)

## 2024-03-13 LAB — URINALYSIS, ROUTINE W REFLEX MICROSCOPIC
Bacteria, UA: NONE SEEN
Bilirubin Urine: NEGATIVE
Glucose, UA: NEGATIVE mg/dL
Hgb urine dipstick: NEGATIVE
Leukocytes,Ua: NEGATIVE
Nitrite: NEGATIVE
Protein, ur: NEGATIVE mg/dL
Specific Gravity, Urine: 1.008 (ref 1.005–1.030)
pH: 6.5 (ref 5.0–8.0)

## 2024-03-13 LAB — HCG, QUANTITATIVE, PREGNANCY: hCG, Beta Chain, Quant, S: <1 m[IU]/mL (ref <5)

## 2024-03-13 LAB — LIPASE, BLOOD: Lipase: 23 U/L (ref 11–51)

## 2024-03-13 MED ORDER — IOHEXOL 300 MG/ML  SOLN
100.0000 mL | Freq: Once | INTRAMUSCULAR | Status: AC | PRN
Start: 2024-03-13 — End: 2024-03-13
  Administered 2024-03-13: 100 mL via INTRAVENOUS

## 2024-03-13 NOTE — ED Triage Notes (Signed)
 Last Friday felt bloated, heavy feeling in abdomen. Pt had 7 bm's on Saturday, not loose, still felt bloated, not her normal. Suprapubic area is the pain . Her temp is 2 degrees above her normal.  Still tender all week, yesterday the pain was moving to her flank and lower back.

## 2024-03-13 NOTE — ED Notes (Signed)
 Patient given discharge instructions. Questions were answered. Patient verbalized understanding of discharge instructions and care at home.

## 2024-03-13 NOTE — ED Provider Notes (Addendum)
 [place] EMERGENCY DEPARTMENT AT [facility] Provider Note   CSN: [id number] Arrival date & time: 03/13/24  9190     Patient presents with: Pelvic Pain   Alexis Wolfe is a 58 y.o. female.   Patient with a complaint of abdominal pain for a week mostly in the lower quadrants.  But now sometimes bilateral flank.  No nausea no vomiting.  Did have frequent bowel movements but not diarrhea.  No blood in the bowel movements.  Patient is never experienced anything like this before.  Temp here 98.1 pulse 95 respirations 20 blood pressure 150/55 oxygen sats are 100% on room air.  Past medical history noncontributory.  Patient has not had a recent colonoscopy but she did have a normal Cologuard test a year ago.  No dysuria no vaginal bleeding or discharge.       Prior to Admission medications   Medication Sig Start Date End Date Taking? Authorizing Provider  Calcium Carbonate-Vit D-Min (CALCIUM 1200 PO) Take by mouth.    [provider]  Multiple Vitamin (MULTIVITAMIN) tablet Take 1 tablet by mouth daily.    [provider]  vitamin B-12 (CYANOCOBALAMIN) 250 MCG tablet Take 250 mcg by mouth daily.    [provider]    Allergies: Patient has no known allergies.    Review of Systems  Constitutional:  Negative for chills and fever.  HENT:  Negative for ear pain and sore throat.   Eyes:  Negative for pain and visual disturbance.  Respiratory:  Negative for cough and shortness of breath.   Cardiovascular:  Negative for chest pain and palpitations.  Gastrointestinal:  Positive for abdominal pain. Negative for vomiting.  Genitourinary:  Negative for dysuria and hematuria.  Musculoskeletal:  Negative for arthralgias and back pain.  Skin:  Negative for color change and rash.  Neurological:  Negative for seizures and syncope.  All other systems reviewed and are negative.   Updated Vital Signs BP 123/76   Pulse 75   Temp 98.1 F (36.7 C) (Oral)    Resp 20   Wt 61.2 kg   SpO2 99%   BMI 21.79 kg/m   Physical Exam Vitals and nursing note reviewed.  Constitutional:      General: She is not in acute distress.    Appearance: Normal appearance. She is well-developed.  HENT:     Head: Normocephalic and atraumatic.   Eyes:     Extraocular Movements: Extraocular movements intact.     Conjunctiva/sclera: Conjunctivae normal.     Pupils: Pupils are equal, round, and reactive to light.    Cardiovascular:     Rate and Rhythm: Normal rate and regular rhythm.     Heart sounds: No murmur heard. Pulmonary:     Effort: Pulmonary effort is normal. No respiratory distress.     Breath sounds: Normal breath sounds.  Abdominal:     Palpations: Abdomen is soft.     Tenderness: There is abdominal tenderness. There is no guarding.     Comments: Some mild tenderness right lower quadrant.   Musculoskeletal:        General: No swelling.     Cervical back: Normal range of motion and neck supple.   Skin:    General: Skin is warm and dry.     Capillary Refill: Capillary refill takes less than 2 seconds.   Neurological:     General: No focal deficit present.     Mental Status: She is alert and oriented to person, place,  and time.   Psychiatric:        Mood and Affect: Mood normal.     (all labs ordered are listed, but only abnormal results are displayed) Labs Reviewed  URINALYSIS, ROUTINE W REFLEX MICROSCOPIC - Abnormal; Notable for the following components:      Result Value   Ketones, ur TRACE (*)    All other components within normal limits  CBC WITH DIFFERENTIAL/PLATELET  LIPASE, BLOOD  COMPREHENSIVE METABOLIC PANEL WITH GFR    EKG: None  Radiology: CT ABDOMEN PELVIS W CONTRAST Result Date: 03/13/2024 CLINICAL DATA:  Suprapubic pain.  83-year-old female. EXAM: CT ABDOMEN AND PELVIS WITH CONTRAST TECHNIQUE: Multidetector CT imaging of the abdomen and pelvis was performed using the standard protocol following bolus  administration of intravenous contrast. RADIATION DOSE REDUCTION: This exam was performed according to the departmental dose-optimization program which includes automated exposure control, adjustment of the mA and/or kV according to patient size and/or use of iterative reconstruction technique. CONTRAST:  100mL OMNIPAQUE IOHEXOL 300 MG/ML  SOLN COMPARISON:  None Available. FINDINGS: Lower chest: Lung bases are clear. Hepatobiliary: Several low-density lesions in the RIGHT hepatic lobe measure 10 mm or less. These cannot be adequately characterized as benign cyst to small size. Gallbladder normal. Pancreas: Pancreas is normal. No ductal dilatation. No pancreatic inflammation. Spleen: Normal spleen Adrenals/urinary tract: Adrenal glands and kidneys are normal. The ureters and bladder normal. Stomach/Bowel: The stomach, duodenum, and small bowel normal. Appendix not identified. The colon and rectosigmoid colon are normal. Vascular/Lymphatic: Abdominal aorta is normal caliber. No periportal or retroperitoneal adenopathy. No pelvic adenopathy. Reproductive: There is a large multi cystic mass arising from the pelvis. Mass measures 13 cm 11 cm in axial dimension (image 63/) and 17 cm in craniocaudad dimension (image 60/sagittal). The mass is predominantly cystic but does have has thickened enhancing internal septations (image 65/2). Enhancing septations measure up to 8 mm in thickness. The mass extends posterosuperior to the uterus. No clear normal ovaries are identified. Potential RIGHT ovarian tissue on the measuring 2.1 cm image 65/2. Favor mass lesion arising from the LEFT ovary. Other: Trace amount of free fluid along the LEFT pericolic gutter (image 51/2 Musculoskeletal: No aggressive osseous lesion. IMPRESSION: 1. Large multicystic mass arising from the pelvis with thickened enhancing septations. 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 Recommend gyn oncology consultation. 2. Favor mass lesion  arising from the LEFT ovary. 3. Trace amount of free fluid along the LEFT pericolic gutter. No evidence of peritoneal nodularity or omental nodularity 4. No evidence of bowel obstruction. 5. No evidence of metastatic adenopathy. 6. Several low-density lesions in the RIGHT hepatic lobe favored benign cystic adenopathy entirely characterized. Consider MRI of the abdomen with and without contrast for further characterization if warranted. Electronically Signed   By: Jackquline Boxer M.D.   On: 03/13/2024 10:29     Procedures   Medications Ordered in the ED  iohexol (OMNIPAQUE) 300 MG/ML solution 100 mL (100 mLs Intravenous Contrast Given 03/13/24 0953)                                    Medical Decision Making Amount and/or Complexity of Data Reviewed Labs: ordered. Radiology: ordered.  Risk Prescription drug management.   Based on patient's abdominal pain will get CT scan abdomen and pelvis.  And CBC complete metabolic panel lipase urinalysis.  Patient's labs without any significant abnormality.  Urinalysis normal CBC  normal.  Complete metabolic panel LFTs are normal renal function is normal.  CT scan abdomen with a large multicystic mass arising from the pelvis presumed to be a mass arising from the left ovary.  No evidence of any distinct metastatic disease within the abdomen.  No adenopathy.  Several low-density lesions right hepatic lobe favored to be cystic adenopathy.  MRI would be needed for additional workup.  Based on these findings we will discuss with on-call GYN oncology Dr. Gretta.  They will have her follow-up with Dr. Lamarr Dollar, OB, GYN oncology.  Patient can be followed up as an outpatient.  I ordered the tumor markers they wanted.  Discussed with patient the CT scan findings.  That she does not want any pain medicine.  Will discharge with follow-up with [facility] cancer center.  We did have some good discussion about reaching out to [facility] cancer center as well.  Patient also  aware that no confirmation on the diagnosis yet but highly suspicious concern for ovarian cancer.  Final diagnoses:  Lower abdominal pain  Left tubo-ovarian mass    ED Discharge Orders     None          Geraldene Hamilton, MD 03/13/24 9141    Geraldene Hamilton, MD 03/13/24 1132    Geraldene Hamilton, MD 03/13/24 1144    Geraldene Hamilton, MD 03/13/24 1236

## 2024-03-13 NOTE — Discharge Instructions (Addendum)
 Discussed with Dr. Gretta today.  You will need to follow-up with the [facility] cancer center for further workup and confirmation of the diagnosis.  Dr. Viktoria information provided above.  Cancer Center may very well give you a call on Monday or Tuesday but I would recommend giving them a call on Monday for follow-up.

## 2024-03-15 LAB — CANCER ANTIGEN 19-9: CA 19-9: 42 U/mL — ABNORMAL HIGH (ref 0–35)

## 2024-03-15 LAB — CA 125: Cancer Antigen (CA) 125: 14.1 U/mL (ref 0.0–38.1)

## 2024-03-15 LAB — CEA: CEA: 7.3 ng/mL — ABNORMAL HIGH (ref 0.0–4.7)

## 2024-03-17 ENCOUNTER — Ambulatory Visit: Admitting: Obstetrics and Gynecology

## 2024-03-17 ENCOUNTER — Ambulatory Visit (INDEPENDENT_AMBULATORY_CARE_PROVIDER_SITE_OTHER)

## 2024-03-17 ENCOUNTER — Encounter: Payer: Self-pay | Admitting: Obstetrics and Gynecology

## 2024-03-17 VITALS — BP 108/76 | HR 98

## 2024-03-17 DIAGNOSIS — T148XXA Other injury of unspecified body region, initial encounter: Secondary | ICD-10-CM

## 2024-03-17 DIAGNOSIS — R1900 Intra-abdominal and pelvic swelling, mass and lump, unspecified site: Secondary | ICD-10-CM

## 2024-03-17 DIAGNOSIS — R52 Pain, unspecified: Secondary | ICD-10-CM

## 2024-03-17 DIAGNOSIS — R970 Elevated carcinoembryonic antigen [CEA]: Secondary | ICD-10-CM | POA: Diagnosis not present

## 2024-03-17 DIAGNOSIS — E2839 Other primary ovarian failure: Secondary | ICD-10-CM

## 2024-03-17 NOTE — Progress Notes (Signed)
 Acute Office Visit  Subjective:    Patient ID: Alexis Wolfe, female    DOB: 12-06-1965, 58 y.o.   MRN: [id number]   HPI 58 y.o. presents today for Consult (Consult to discuss referral to [facility] from ER visit//jj/Ct done 03-13-24) .Started having bloating and lower back pain and increased pressure with sitting. No PMB, no rectal bleeding Went to ER and found a 17cm pelvic mass favor mass lesions arising from left ovary Patient is able to bend but cannot sit and is in pain She is healthy and did have a recent fall and broke her wrist No colonoscopy but had a cologuard 1 year ago Ca125 normal CA19-9 and CEA increased To get OVA-1  No LMP recorded. Patient is postmenopausal.   Also with recent fall and fracture in her wrist- to get bone scan Review of Systems     Objective:    OBGyn Exam  BP 108/76   Pulse 98   SpO2 98%  Wt Readings from Last 3 Encounters:  03/13/24 135 lb (61.2 kg)  12/22/23 137 lb 9.6 oz (62.4 kg)  03/05/23 136 lb (61.7 kg)   Past Surgical History:  Procedure Laterality Date   AUGMENTATION MAMMAPLASTY  12/23/2002   saline   WISDOM TOOTH EXTRACTION     Social History   Socioeconomic History   Marital status: Married    Spouse name: Not on file   Number of children: Not on file   Years of education: Not on file   Highest education level: Not on file  Occupational History   Not on file  Tobacco Use   Smoking status: Never   Smokeless tobacco: Never  Vaping Use   Vaping status: Never Used  Substance and Sexual Activity   Alcohol use: Never   Drug use: Never   Sexual activity: Yes    Partners: Male    Birth control/protection: Post-menopausal    Comment: married- 7 yrs, 1st intercourse- 18, partners- 5  Other Topics Concern   Not on file  Social History Narrative   ** Merged History Encounter **       Social Drivers of Corporate investment banker Strain: Not on file  Food Insecurity: Not on file  Transportation Needs: Not on file   Physical Activity: Not on file  Stress: Not on file  Social Connections: Not on file  Intimate Partner Violence: Not on file   Current Outpatient Medications on File Prior to Visit  Medication Sig Dispense Refill   Calcium Carbonate-Vit D-Min (CALCIUM 1200 PO) Take by mouth.     Multiple Vitamin (MULTIVITAMIN) tablet Take 1 tablet by mouth daily.     vitamin B-12 (CYANOCOBALAMIN) 250 MCG tablet Take 250 mcg by mouth daily.     No current facility-administered medications on file prior to visit.   OB History  Gravida Para Term Preterm AB Living  2 2 2  0 0 2  SAB IAB Ectopic Multiple Live Births  0 0 0      # Outcome Date GA Lbr Len/2nd Weight Sex Type Anes PTL Lv  2 Term           1 Term            No Known Allergies  Pap smear normal last year    PUS with no torsion but complex likely ovarian mass 22cm Exam: +guarding and difficulty laying or sitting down due to the pain Large tender mass palpated. Patient in extreme pain with PUS  History  reviewed. No pertinent past medical history.   Assessment & Plan:  Pelvic mass, elevated colon cancer markers, likely ovarian in origin Patient desires surgery at [facility].  Referral placed. Due to current pain, inability to sit or walk without discomfort, discussed to go to [facility] ER today and get evaluation and surgery with gynoncology department.  Patient should not wait and be evaluated.  Patient told not to eat or drink until she is evaluated by them.  Discussed cannot rule out cancer 100% until pathological diagnosis ova1 marker sent Referral to GI placed 5. [facility] gynonc referral center called and could not get a return call.  Dr. Viktoria provided RN oncology coordinator, [place], and she was able to notify gynonc team and is appreciated. Patient agreed to keep us  posted and return for follow-up. Alexis Wolfe

## 2024-03-22 ENCOUNTER — Ambulatory Visit: Payer: Self-pay | Admitting: Obstetrics and Gynecology

## 2024-03-22 LAB — OVARIAN MALIGNANCY RISK-ROMA
CA125: 19 U/mL (ref <35)
HE4, OVARIAN CANCER MONITORING: 33 pmol/L
ROMA Postmenopausal: 0.9 (ref <2.77)
ROMA Premenopausal: 0.29 (ref <1.31)

## 2024-03-24 ENCOUNTER — Encounter: Payer: Self-pay | Admitting: Obstetrics and Gynecology

## 2024-04-01 ENCOUNTER — Telehealth: Payer: Self-pay

## 2024-04-01 NOTE — Telephone Encounter (Signed)
 Patient had surgery on 7/1 at [facility].. deleted referral from the [facility]

## 2024-04-06 ENCOUNTER — Telehealth (HOSPITAL_BASED_OUTPATIENT_CLINIC_OR_DEPARTMENT_OTHER): Payer: Self-pay

## 2024-05-03 ENCOUNTER — Other Ambulatory Visit (HOSPITAL_COMMUNITY)
Admission: RE | Admit: 2024-05-03 | Discharge: 2024-05-03 | Disposition: A | Discharge status: Home / Self Care | Source: Ambulatory Visit | Attending: Plastic Surgery | Admitting: Plastic Surgery

## 2024-05-03 ENCOUNTER — Encounter: Payer: Self-pay | Admitting: Plastic Surgery

## 2024-05-03 ENCOUNTER — Ambulatory Visit: Admitting: Plastic Surgery

## 2024-05-03 VITALS — BP 148/78 | HR 71 | Ht 65.0 in | Wt 124.8 lb

## 2024-05-03 DIAGNOSIS — D049 Carcinoma in situ of skin, unspecified: Secondary | ICD-10-CM

## 2024-05-03 DIAGNOSIS — R238 Other skin changes: Secondary | ICD-10-CM | POA: Diagnosis not present

## 2024-05-03 DIAGNOSIS — Z85828 Personal history of other malignant neoplasm of skin: Secondary | ICD-10-CM

## 2024-05-03 NOTE — Progress Notes (Signed)
 Procedure Note  Preoperative Dx: right leg SCC  Postoperative Dx: Same  Procedure: Excision of right leg squamous cell carcinoma 2.1 cm  Anesthesia: Lidocaine 1% with 1:100,000 epinephrine  Indication for Procedure: cancer  Description of Procedure: Risks and complications were explained to the patient.  Consent was confirmed and the patient understands the risks and benefits.  The potential complications and alternatives were explained and the patient consents.  The patient expressed understanding the option of not having the procedure and the risks of a scar.  Time out was called and all information was confirmed to be correct.    The area was prepped and drapped.  Lidocaine 1% with epinephrine was injected in the subcutaneous area.  After waiting several minutes for the local to take affect a #15 blade was used to excise the area in an eliptical pattern with 2 mm borders from the biopsy site.  A 5-0 Monocryl was used to close the deep layers with simple interrupted stitches.  The skin edges were reapproximated with 5-0 Monocryl.  A dressing was applied.  The patient was given instructions on how to care for the area and a follow up appointment.  Alexis Wolfe tolerated the procedure well and there were no complications. The specimen was sent to pathology.

## 2024-05-06 LAB — SURGICAL PATHOLOGY

## 2024-05-12 NOTE — Progress Notes (Unsigned)
 Patient is a pleasant 58 year old female s/p excision of right leg SCC performed 05/03/2024 by Dr. Lowery who returns to clinic for postprocedural follow-up.  Reviewed procedural report and skin is closed with 5-0 Monocryl followed by dressing.  No residual SCC in situ noted and the margins were free of malignancy.  Today, she is doing well.  No complaints.  She thinks that she may have some residual sutures.  On exam, skin edges appear well-approximated.  Mild erythema peri-incisional he, but no tenderness, induration, or cellulitic changes.  Scattered sutures removed without complication.  Patient understands that excision sites over the tibia are typically a bit delayed in healing.  Recommending Vaseline for the next week.  Suspect the erythema is simply irritation.  No evidence concerning for infection.  She can transition to silicone scar gel after 1 week.  Of note, she begins chemotherapy in 2 weeks.  If it is still not fully healed at that time, it may be further delayed as a result of chemotherapy.  Either way, cleared to proceed to scheduled from our standpoint.    Follow-up only as needed

## 2024-05-13 ENCOUNTER — Ambulatory Visit (INDEPENDENT_AMBULATORY_CARE_PROVIDER_SITE_OTHER): Admitting: Physician Assistant

## 2024-05-13 DIAGNOSIS — Z719 Counseling, unspecified: Secondary | ICD-10-CM

## 2024-05-13 DIAGNOSIS — D049 Carcinoma in situ of skin, unspecified: Secondary | ICD-10-CM

## 2024-05-25 NOTE — Progress Notes (Signed)
 VASCULAR AND INTERVENTIONAL RADIOLOGY PROCEDURE NOTE  Pre-procedure Diagnosis: Malignancy  Procedure: Single lumen power port placement  Medications:  lidocaine-EPINEPHrine 1 %-1:100,000 injection 10 mL - 10 mL midazolam (VERSED) 1 mg/mL injection - 1 mg fentaNYL (PF) (SUBLIMAZE) injection - 50 mcg ceFAZolin in dextrose (ANCEF) IVPB 2 g/50 mL - Not documented*  *Total volume has not been documented. View each administration to see the amount administered. (Totals for administrations occurring from 1500 to 1524 on 05/25/24)  Complications: None immediate.  EBL: Negligible  Specimen Removed: No  Findings: 1. Patent right internal jugular vein.  Successful Single lumen power port placement.  Post-procedure Diagnosis: Same  Plan: 1. Port flushes and is ready for immediate use.  Interventional Attending: Alm Louder, MD; [phone]

## 2024-05-26 NOTE — Progress Notes (Signed)
 Oncology Pharmacy Chemotherapy Education Note  Kinsly Hild is a 58 y.o. 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  The patient's medical oncologist is Dr. Sheilda.  Patient presents to clinic today for evaluation.  Pharmacist was consulted to provide education regarding cycle # 1. The patient has not been previously treated.  The patients allergies and home medications were updated and reviewed as follows:  Allergies as of 05/26/2024 - Reviewed 05/26/2024  Allergen Reaction Noted  . Sulfa (sulfonamide antibiotics) Nausea and Abdominal Pain 03/22/2024     Current Medications: Outpatient Encounter Medications as of 05/26/2024  Medication Sig Dispense Refill  . cyanocobalamin (VITAMIN B12) 250 MCG tablet Take 250 mcg by mouth once daily    . dexAMETHasone (DECADRON) 4 MG tablet Take 8mg  (2 x 4mg  tablets) by mouth in the morning for 2 days after oxaliplatin chemotherapy, then as directed. (Patient not taking: Reported on 05/26/2024) 30 tablet 2  . multivit comb no.63-folic acid 400 mcg Cap Take 1 capsule by mouth once daily    . multivitamin tablet Take 1 tablet by mouth once daily    . ondansetron (ZOFRAN) 8 MG tablet Take 1 tablet (8 mg total) by mouth every 12 (twelve) hours as needed for Nausea or Vomiting (Patient not taking: Reported on 05/26/2024) 60 tablet 2  . oxyCODONE (ROXICODONE) 5 MG immediate release tablet Take 1 tablet (5 mg total) by mouth every 4 (four) hours as needed for up to 15 doses (Patient not taking: Reported on 04/22/2024) 15 tablet 0  . prochlorperazine (COMPAZINE) 10 MG tablet Take 1 tablet (10 mg total) by mouth every 6 (six) hours as needed for Nausea (Patient not taking: Reported on 05/26/2024) 60 tablet 2   No facility-administered encounter medications on file as of 05/26/2024.     1. CINV Risk Assessment: The patient's chemotherapy regimen has a Moderate risk of emetogenicity based on the drugs, their doses and  schedule. Additional risk factors that may increase this patient's risk include: gender, age, history of vomiting with prior chemo, history of motion sickness, history of vomiting with pregnancy, and history of anxiety or depression.   2.  Chemotherapy education: The chemotherapy regimen was discussed with the patient/family including treatment schedule, potential side effects, and management of side effects.  Potential side effects discussed included myelosuppression, infection, nausea, vomiting, fatigue, diarrhea, alopecia, neurotoxicity, mucositis, hand/foot syndrome, photosensitivity, and coronary vasospasm.   Management techniques of these side effects were discussed.  In addition, written materials were provided including: a treatment calendar, regimen-specific side effect handouts, and general chemotherapy instructions, including When to Call Your Health Care Team.  Prescriptions for all supportive care medications were discussed in terms of use and potential side effects.    All questions were answered.  Patient/family verbalized understanding of the plan of care and management of side effects.  Spent approximately 15 minutes face to face with patient. Will follow as necessary.  Olam Bookbinder PharmD BCOP
